# Patient Record
Sex: Female | Born: 1975 | Race: White | Hispanic: No | Marital: Married | Smoking: Never smoker
Health system: Southern US, Community
[De-identification: ages and names within clinical notes are randomized; demographics above are authoritative.]

---

## 2017-12-04 DIAGNOSIS — H524 Presbyopia: Secondary | ICD-10-CM | POA: Diagnosis not present

## 2019-05-09 ENCOUNTER — Other Ambulatory Visit: Payer: Self-pay | Admitting: *Deleted

## 2019-05-09 DIAGNOSIS — Z20822 Contact with and (suspected) exposure to covid-19: Secondary | ICD-10-CM

## 2019-05-10 LAB — NOVEL CORONAVIRUS, NAA: SARS-CoV-2, NAA: NOT DETECTED

## 2019-05-29 DIAGNOSIS — Z0000 Encounter for general adult medical examination without abnormal findings: Secondary | ICD-10-CM | POA: Diagnosis not present

## 2019-05-29 DIAGNOSIS — Z8249 Family history of ischemic heart disease and other diseases of the circulatory system: Secondary | ICD-10-CM | POA: Diagnosis not present

## 2019-06-27 DIAGNOSIS — Z01419 Encounter for gynecological examination (general) (routine) without abnormal findings: Secondary | ICD-10-CM | POA: Diagnosis not present

## 2019-07-14 ENCOUNTER — Other Ambulatory Visit: Payer: Self-pay

## 2019-07-14 DIAGNOSIS — Z20822 Contact with and (suspected) exposure to covid-19: Secondary | ICD-10-CM

## 2019-07-15 LAB — NOVEL CORONAVIRUS, NAA: SARS-CoV-2, NAA: NOT DETECTED

## 2019-08-08 ENCOUNTER — Other Ambulatory Visit: Payer: Self-pay

## 2019-08-08 DIAGNOSIS — Z20822 Contact with and (suspected) exposure to covid-19: Secondary | ICD-10-CM

## 2019-08-10 LAB — NOVEL CORONAVIRUS, NAA: SARS-CoV-2, NAA: NOT DETECTED

## 2019-08-12 ENCOUNTER — Other Ambulatory Visit: Payer: Self-pay

## 2019-11-10 ENCOUNTER — Ambulatory Visit: Payer: BC Managed Care – PPO | Attending: Internal Medicine

## 2019-11-10 DIAGNOSIS — Z20822 Contact with and (suspected) exposure to covid-19: Secondary | ICD-10-CM | POA: Insufficient documentation

## 2019-11-11 LAB — SARS-COV-2, NAA 2 DAY TAT

## 2019-11-11 LAB — NOVEL CORONAVIRUS, NAA: SARS-CoV-2, NAA: NOT DETECTED

## 2020-01-15 DIAGNOSIS — H5213 Myopia, bilateral: Secondary | ICD-10-CM | POA: Diagnosis not present

## 2020-06-15 DIAGNOSIS — Z0000 Encounter for general adult medical examination without abnormal findings: Secondary | ICD-10-CM | POA: Diagnosis not present

## 2020-06-15 DIAGNOSIS — Z23 Encounter for immunization: Secondary | ICD-10-CM | POA: Diagnosis not present

## 2020-06-15 DIAGNOSIS — Z13220 Encounter for screening for lipoid disorders: Secondary | ICD-10-CM | POA: Diagnosis not present

## 2020-06-16 ENCOUNTER — Other Ambulatory Visit: Payer: Self-pay | Admitting: Internal Medicine

## 2020-06-16 DIAGNOSIS — Z1231 Encounter for screening mammogram for malignant neoplasm of breast: Secondary | ICD-10-CM

## 2020-07-29 ENCOUNTER — Other Ambulatory Visit: Payer: Self-pay

## 2020-07-29 ENCOUNTER — Ambulatory Visit
Admission: RE | Admit: 2020-07-29 | Discharge: 2020-07-29 | Disposition: A | Discharge status: Home / Self Care | Payer: BC Managed Care – PPO | Source: Ambulatory Visit | Attending: Internal Medicine | Admitting: Internal Medicine

## 2020-07-29 DIAGNOSIS — Z1231 Encounter for screening mammogram for malignant neoplasm of breast: Secondary | ICD-10-CM

## 2021-09-16 IMAGING — MG DIGITAL SCREENING BILAT W/ TOMO W/ CAD
8 series · 9 of 24 positions shown · non-contrast
Comparison: None.

CLINICAL DATA: Screening.

EXAM:
DIGITAL SCREENING BILATERAL MAMMOGRAM WITH TOMO AND CAD

[L MLO synth-2D]
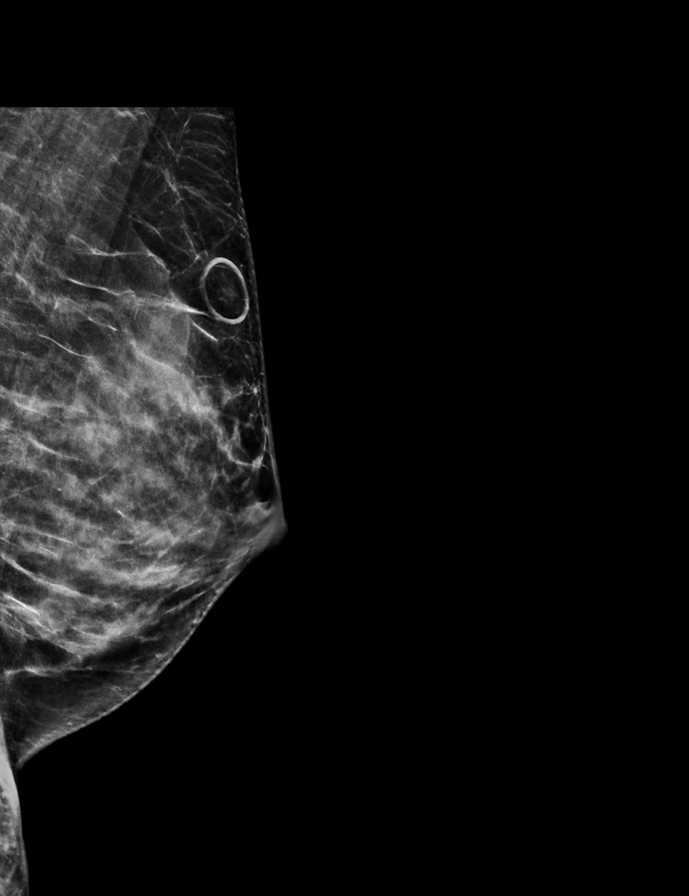

[L CC synth-2D]
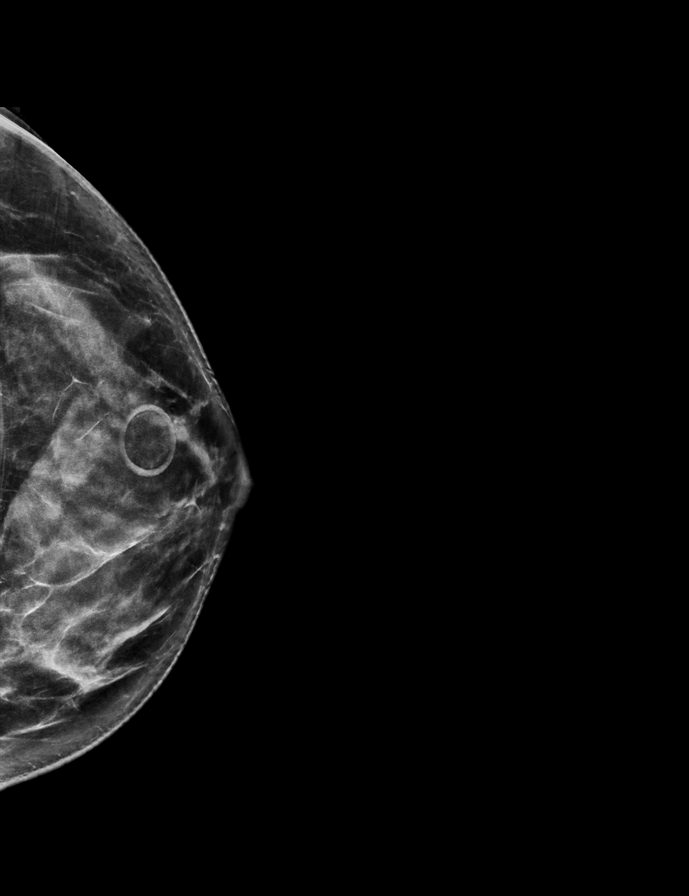

[R CC synth-2D]
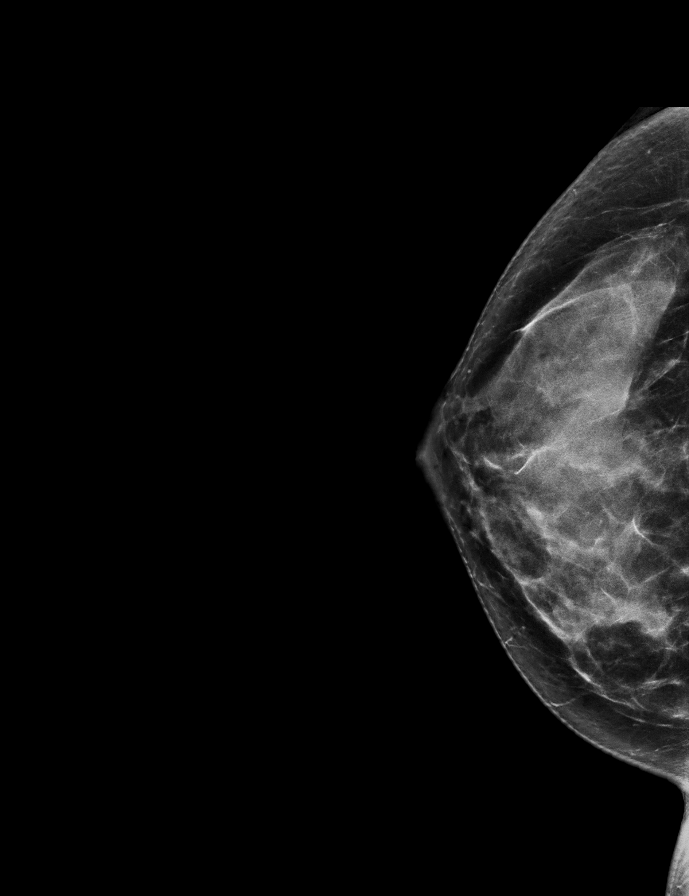

[R MLO synth-2D]
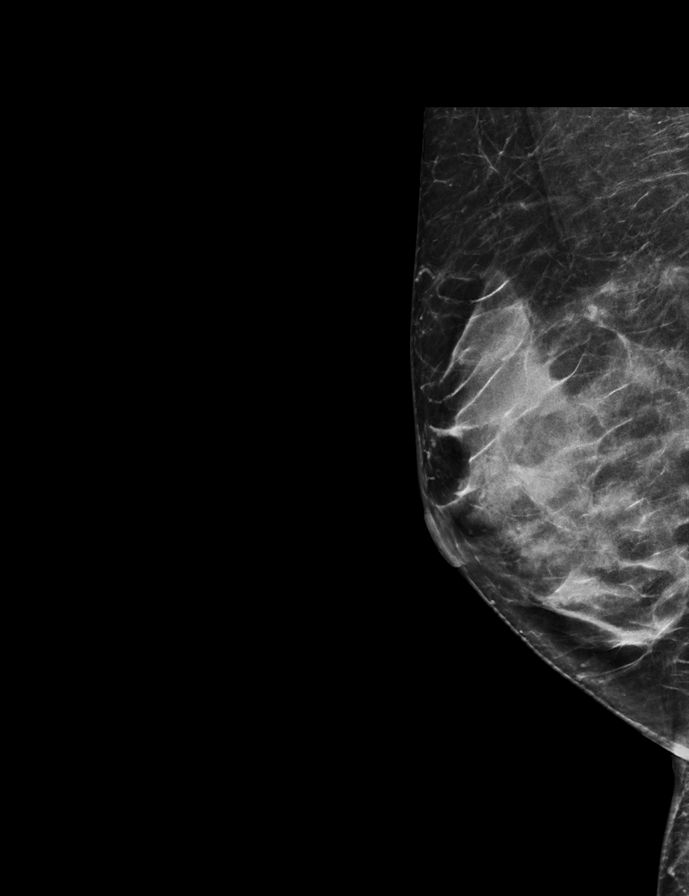

[L CC tomo · 2 of 67 frames shown]
[frame 22/67]
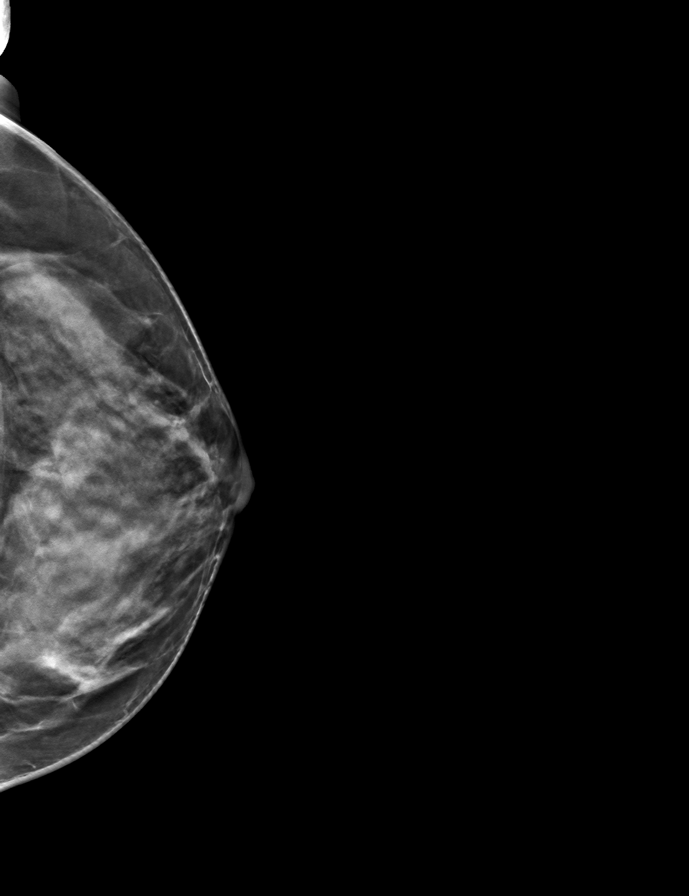
[frame 34/67]
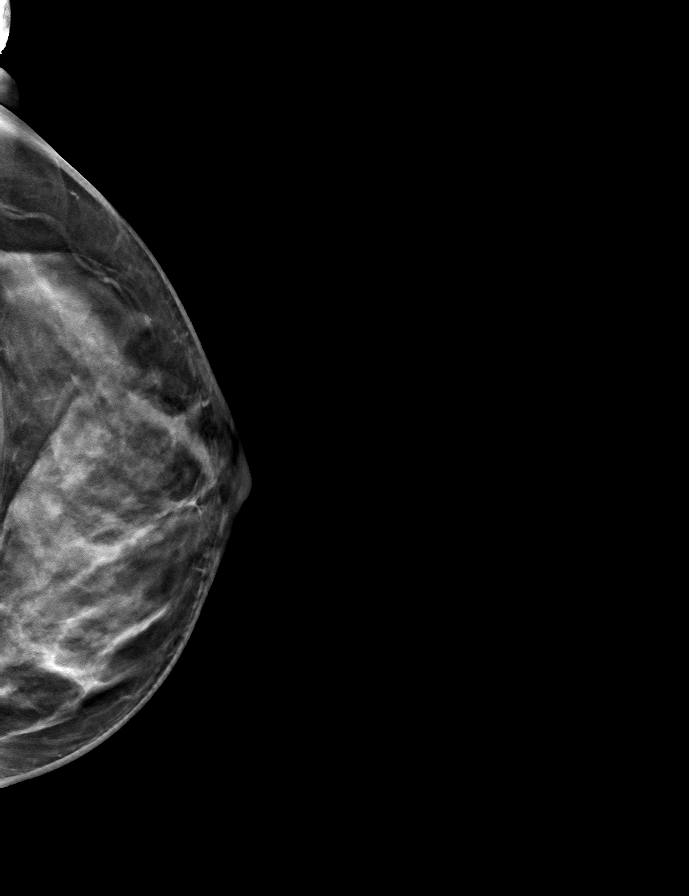

[L MLO tomo · tomo slice 31/60.0]
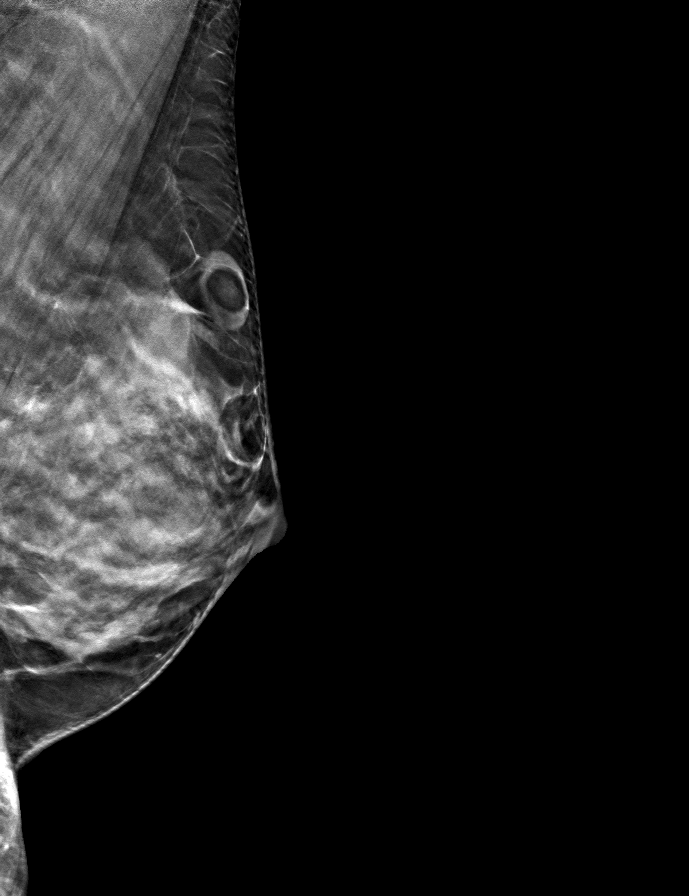

[R CC tomo · tomo slice 33/64.0]
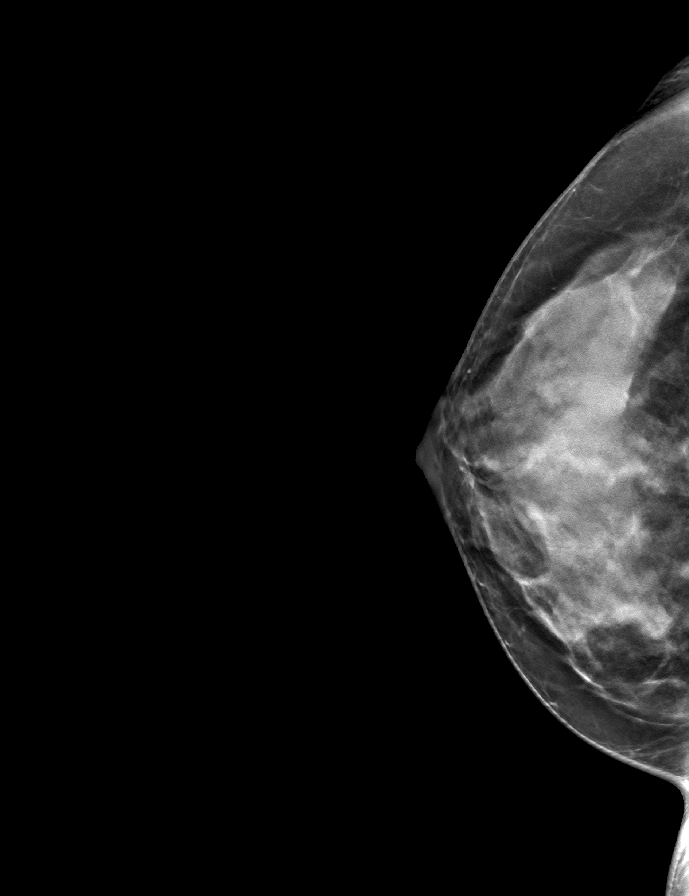

[R MLO tomo · tomo slice 31/61.0]
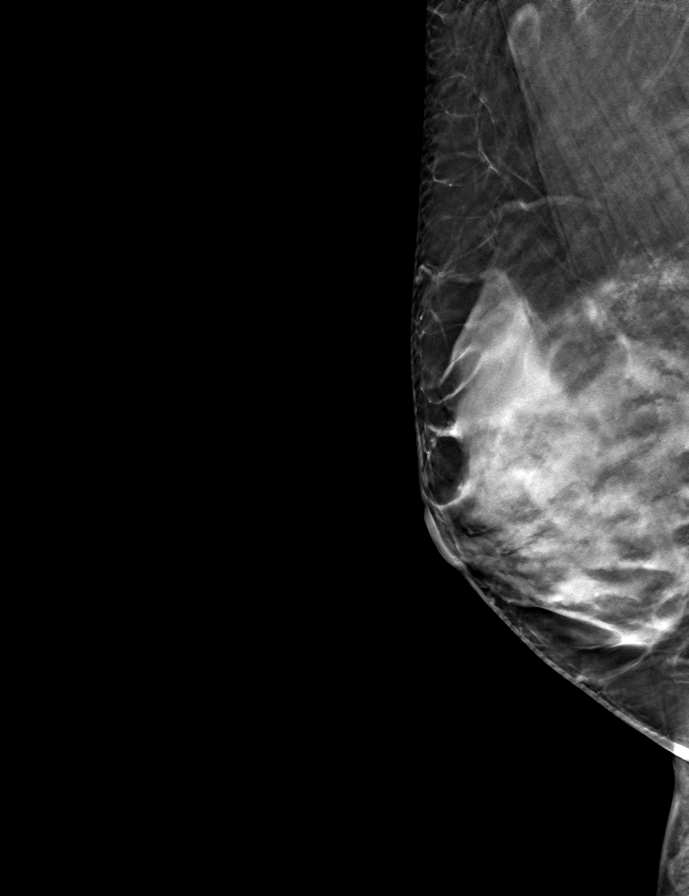

[9 of 24 positions shown; findings below may reference images not displayed]

ACR Breast Density Category c: The breast tissue is heterogeneously
dense, which may obscure small masses
FINDINGS: There are no findings suspicious for malignancy. Images were
processed with CAD.
IMPRESSION: No mammographic evidence of malignancy. A result letter of this
screening mammogram will be mailed directly to the patient.

RECOMMENDATION:
Screening mammogram in one year. (Code:EM-2-IHY)

BI-RADS CATEGORY  1: Negative.

## 2022-01-09 ENCOUNTER — Other Ambulatory Visit: Payer: Self-pay | Admitting: Internal Medicine

## 2022-01-09 DIAGNOSIS — Z1231 Encounter for screening mammogram for malignant neoplasm of breast: Secondary | ICD-10-CM

## 2022-02-13 ENCOUNTER — Ambulatory Visit
Admission: RE | Admit: 2022-02-13 | Discharge: 2022-02-13 | Disposition: A | Discharge status: Home / Self Care | Payer: 59 | Source: Ambulatory Visit | Attending: Internal Medicine | Admitting: Internal Medicine

## 2022-02-13 DIAGNOSIS — Z1231 Encounter for screening mammogram for malignant neoplasm of breast: Secondary | ICD-10-CM

## 2022-12-26 ENCOUNTER — Ambulatory Visit (INDEPENDENT_AMBULATORY_CARE_PROVIDER_SITE_OTHER): Payer: 59

## 2022-12-26 ENCOUNTER — Encounter: Payer: Self-pay | Admitting: Podiatry

## 2022-12-26 ENCOUNTER — Ambulatory Visit (INDEPENDENT_AMBULATORY_CARE_PROVIDER_SITE_OTHER): Payer: 59 | Admitting: Podiatry

## 2022-12-26 DIAGNOSIS — M2012 Hallux valgus (acquired), left foot: Secondary | ICD-10-CM | POA: Diagnosis not present

## 2022-12-26 DIAGNOSIS — M2011 Hallux valgus (acquired), right foot: Secondary | ICD-10-CM | POA: Diagnosis not present

## 2022-12-26 DIAGNOSIS — M2010 Hallux valgus (acquired), unspecified foot: Secondary | ICD-10-CM

## 2022-12-26 DIAGNOSIS — M722 Plantar fascial fibromatosis: Secondary | ICD-10-CM

## 2022-12-27 NOTE — Progress Notes (Signed)
  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 female presents with the above complaint.   ROS: Denies fever chills nausea vomit muscle aches pains calf pain back pain chest pain shortness of breath.  Conservative therapies such as anti-inflammatories shoe gear changes and sugar modifications have failed to alleviate patient's symptomatology.  No past medical history on file. No past surgical history on file.  Current Outpatient Medications:    [employer] GENTLE LAXATIVE 5 MG EC tablet, See admin instructions., Disp: , Rfl:    escitalopram (LEXAPRO) 5 MG tablet, 1 tablet Orally Once a day for 90 days, Disp: , Rfl:   No Known Allergies Review of Systems Objective:  There were no vitals filed for this visit.  General: Well developed, nourished, in no acute distress, alert and oriented x3   Dermatological: Skin is warm, dry and supple bilateral. Nails x 10 are well maintained; remaining integument appears unremarkable at this time. There are no open sores, no preulcerative lesions, no rash or signs of infection present.  Vascular: Dorsalis Pedis artery and Posterior Tibial artery pedal pulses are 2/4 bilateral with immedate capillary fill time. Pedal hair growth present. No varicosities and no lower extremity edema present bilateral.   Neruologic: Grossly intact via light touch bilateral. Vibratory intact via tuning fork bilateral. Protective threshold with Semmes Wienstein monofilament intact to all pedal sites bilateral. Patellar and Achilles deep tendon reflexes 2+ bilateral. No  Babinski or clonus noted bilateral.   Musculoskeletal: No gross boney pedal deformities bilateral. No pain, crepitus, or limitation noted with foot and ankle range of motion bilateral. Muscular strength 5/5 in all groups tested bilateral.  Hallux abductovalgus deformity of the left foot is noted.  She has a tenderness on the medial condyle with palpation and on end range of motion of the joint.  She also has pain on palpation MucoClear tubercle of the right heel.  No pain to medial lateral compression of the calcaneus.    Gait: Unassisted, Nonantalgic.    Radiographs:  Radiographs taken today demonstrate osseously mature individual right foot demonstrates retention of a 6 2 K wire first metatarsal.  The soft tissue increase in density plantar fascial cannula insertion site of the right heel.  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  Assessment & Plan:   Assessment: Hallux abductovalgus deformity of the left foot plan fasciitis right foot.  Plan: Discussed etiology pathology conservative surgical therapies at this point she would like to consider postponing surgery to the left foot until the fall.     Marygrace Sandoval T. Wright, [place]

## 2023-04-02 ENCOUNTER — Other Ambulatory Visit: Payer: Self-pay | Admitting: Internal Medicine

## 2023-04-02 DIAGNOSIS — Z1231 Encounter for screening mammogram for malignant neoplasm of breast: Secondary | ICD-10-CM

## 2023-04-16 ENCOUNTER — Ambulatory Visit: Admission: RE | Admit: 2023-04-16 | Payer: 59 | Source: Ambulatory Visit

## 2023-04-16 DIAGNOSIS — Z1231 Encounter for screening mammogram for malignant neoplasm of breast: Secondary | ICD-10-CM

## 2023-05-22 ENCOUNTER — Encounter: Payer: Self-pay | Admitting: Podiatry

## 2023-05-22 ENCOUNTER — Ambulatory Visit (INDEPENDENT_AMBULATORY_CARE_PROVIDER_SITE_OTHER): Payer: 59 | Admitting: Podiatry

## 2023-05-22 DIAGNOSIS — M2012 Hallux valgus (acquired), left foot: Secondary | ICD-10-CM | POA: Diagnosis not present

## 2023-05-22 NOTE — Progress Notes (Signed)
She presents today discuss the ankle bunion deformity.  She states that she had a right Winfex many years ago and become more painful with shoe gear.  She is try different shoe gear.  She is also tried anti-inflammatories physical therapy padding and inserts.  She denies fever chills nausea vomiting muscle aches pains calf pain back pain chest pain shortness of breath.  States that in general she is in good health.  No known drug allergies  Objective: Vital signs are stable alert oriented x 3.  Pulses are palpable.  Muscle strength is normal +5/5 dorsiflexors plantar flexors inverters everters.  She does have some minimal crepitation on range of motion.  She does have a good full range of motion however.  Hallux abductovalgus deformity is semiflexible appears to be tracking but not yet tract found she has painful medial eminence of the head of the first metatarsal with palpation of the medial dorsal cutaneous nerve.  Radiographs previously evaluated demonstrate an increase in the first intermetatarsal angle greater than normal value of the increased PASA and a hallux abductus angle greater than normal value.  Does not appear to be any dorsal spurring or any fractures.  Assessment: Hallux abductovalgus deformity bilateral right 1 have been corrected left 1 is painful and needs correction.  Plan: Discussed etiology pathology conservative versus surgical therapies at this point consented her today for an [facility] bunion repair with screw fixation.  We did discuss the pros and cons of this surgery as well as the possible side effects associated with it.  Possible complications which may include but are not limited to postop pain bleeding swell infection recurrence need for further surgery overcorrection under correction loss of digit loss limb loss of life.  We did discuss the necessity for sciatic block cam boot and for her to stay off of this for least the first 2 weeks 45 minutes/h.  She understands this  and does need to agree to follow-up with her in December for the surgery.  Signed all 3 pages a consent form today and was taken to scheduling.

## 2023-07-05 ENCOUNTER — Telehealth: Payer: Self-pay | Admitting: Podiatry

## 2023-07-05 NOTE — Telephone Encounter (Signed)
DOS-07/27/2023  [place] BUNIONECTOMY [id number]  [employer] EFFECTIVE DATE-08/21/2022  DEDUCTIBLE-$4500.00 WITH REMAINING $3417.33 OOP-$6500.00 WITH REMAINING $5409.50 COINSURANCE- 30%   PER THE [employer] PORTAL, PRIOR AUTH HAS BEEN APPROVED FOR CPT CODE 75643, GOOD FROM 07/27/2023 - 10/25/2023.  AUTH REFERENCE #: [id number]

## 2023-07-25 ENCOUNTER — Other Ambulatory Visit: Payer: Self-pay | Admitting: Podiatry

## 2023-07-25 MED ORDER — CEPHALEXIN 500 MG PO CAPS: 500.0000 mg | ORAL_CAPSULE | Freq: Three times a day (TID) | ORAL | 0 refills | Status: DC

## 2023-07-25 MED ORDER — OXYCODONE-ACETAMINOPHEN 10-325 MG PO TABS: 1.0000 | ORAL_TABLET | Freq: Three times a day (TID) | ORAL | 0 refills | Status: AC | PRN

## 2023-07-25 MED ORDER — ONDANSETRON HCL 4 MG PO TABS: 4.0000 mg | ORAL_TABLET | Freq: Three times a day (TID) | ORAL | 0 refills | Status: AC | PRN

## 2023-07-27 DIAGNOSIS — M2012 Hallux valgus (acquired), left foot: Secondary | ICD-10-CM | POA: Diagnosis not present

## 2023-07-28 ENCOUNTER — Encounter: Payer: Self-pay | Admitting: Podiatry

## 2023-07-28 MED ORDER — IBUPROFEN 800 MG PO TABS: 800.0000 mg | ORAL_TABLET | Freq: Four times a day (QID) | ORAL | 1 refills | Status: AC | PRN

## 2023-07-31 ENCOUNTER — Encounter: Payer: Self-pay | Admitting: Podiatry

## 2023-07-31 ENCOUNTER — Ambulatory Visit (INDEPENDENT_AMBULATORY_CARE_PROVIDER_SITE_OTHER): Payer: 59

## 2023-07-31 ENCOUNTER — Ambulatory Visit (INDEPENDENT_AMBULATORY_CARE_PROVIDER_SITE_OTHER): Payer: 59 | Admitting: Podiatry

## 2023-07-31 DIAGNOSIS — M2012 Hallux valgus (acquired), left foot: Secondary | ICD-10-CM

## 2023-07-31 DIAGNOSIS — Z9889 Other specified postprocedural states: Secondary | ICD-10-CM

## 2023-07-31 NOTE — Progress Notes (Signed)
She presents today for her first postop visit date of surgery was 07/27/2023.  She had a left foot [place] bunion repair with screw fixation.  States this really been painful left first few days but has slowly started to subside now she is taking 800 mg of ibuprofen and no longer taking the narcotic.  Denies fever chills nausea vomiting muscle aches pains calf pain back pain chest pain shortness of breath.  She presents today ambulating with her cam boot dry sterile dressing intact once removed demonstrates mild edema no erythema cellulitis drainage or odor incision site is gone on to heal uneventfully.  Radiographically does demonstrate correction with the capital osteotomy to the first metatarsal and single screw fixation.  Assessment: Well-healing surgical foot.  Plan: Redressed today dressed a compressive dressing encouraged her to continue to keep the foot elevated and to move the toes regularly so that they do not get stiff.  She understands this is amenable to it we will follow-up with me in a week or 2 at which time will try to remove the suture ends.

## 2023-08-02 ENCOUNTER — Encounter: Payer: 59 | Admitting: Podiatry

## 2023-08-06 ENCOUNTER — Telehealth: Payer: Self-pay

## 2023-08-06 NOTE — Telephone Encounter (Signed)
Patient called - she is really doing great - wants to know when she can get her foot wet/Shower ? Please advise -thanks

## 2023-08-21 ENCOUNTER — Ambulatory Visit (INDEPENDENT_AMBULATORY_CARE_PROVIDER_SITE_OTHER): Payer: 59 | Admitting: Podiatry

## 2023-08-21 DIAGNOSIS — M2012 Hallux valgus (acquired), left foot: Secondary | ICD-10-CM

## 2023-08-21 NOTE — Progress Notes (Signed)
 She presents today for a postop visit date of surgery was 07/27/2023.  Left foot [place] bunionectomy with screw fixation states that the pain is mostly gone she is able to move it better.  States that the swelling is gone down and she is feeling much better.  Objective: Vital signs are stable alert oriented times 3 suture ends were intact removed today no dehiscence she has great range of motion passively and actively.  Assessment: Well-healing surgical foot.  Plan: Discharge in a.m. Darco shoe today she will wear this the majority of the time and I will follow-up with her on in 3 weeks for set of x-rays.

## 2023-09-11 ENCOUNTER — Encounter: Payer: 59 | Admitting: Podiatry

## 2023-09-13 ENCOUNTER — Encounter: Payer: 59 | Admitting: Podiatry

## 2023-09-27 ENCOUNTER — Encounter: Payer: Self-pay | Admitting: Podiatry

## 2023-09-27 ENCOUNTER — Ambulatory Visit (INDEPENDENT_AMBULATORY_CARE_PROVIDER_SITE_OTHER): Payer: 59

## 2023-09-27 ENCOUNTER — Ambulatory Visit: Payer: 59 | Admitting: Podiatry

## 2023-09-27 DIAGNOSIS — M2012 Hallux valgus (acquired), left foot: Secondary | ICD-10-CM

## 2023-09-27 DIAGNOSIS — Z9889 Other specified postprocedural states: Secondary | ICD-10-CM

## 2023-09-27 NOTE — Progress Notes (Signed)
 She presents today for her third postop visit date of surgery 07/27/2023.  She has a left [employer] with screw fixation.  She states that is doing so much better she is very happy with the outcome.  Objective: Vital signs are stable alert oriented x 3 which decrease in edema much decrease in tenderness she is got a great range of motion of the first metatarsal phalangeal joint.  Radiographs taken today demonstrate well-healing surgical foot internal fixation is in good position bone appears to be healing that has not healed completely as of yet at the osteotomy site.  Assessment: Well-healing surgical foot x 2 weeks.  Plan: Follow-up with me in 1 month.  I will allow her to get back to her riding her horse.

## 2023-10-25 ENCOUNTER — Encounter: Payer: 59 | Admitting: Podiatry

## 2023-10-30 ENCOUNTER — Ambulatory Visit (INDEPENDENT_AMBULATORY_CARE_PROVIDER_SITE_OTHER): Payer: 59 | Admitting: Podiatry

## 2023-10-30 ENCOUNTER — Encounter: Payer: Self-pay | Admitting: Podiatry

## 2023-10-30 ENCOUNTER — Ambulatory Visit (INDEPENDENT_AMBULATORY_CARE_PROVIDER_SITE_OTHER)

## 2023-10-30 DIAGNOSIS — M2012 Hallux valgus (acquired), left foot: Secondary | ICD-10-CM

## 2023-10-30 DIAGNOSIS — Z9889 Other specified postprocedural states: Secondary | ICD-10-CM

## 2023-10-31 NOTE — Progress Notes (Signed)
 She presents today date of surgery 07/27/2023.  She had a left [place] bunionectomy with screw fixation.  States that she just returned from [place] where she did a ton of walking and had no problems whatsoever.  Objective: Vital signs are stable she is alert oriented x 3.  Pulses are palpable.  Mild edema about the distal aspect of the foot.  She has good range of motion of the first metatarsophalangeal joint without crepitation.  The foot is sitting rectus  Radiographs taken today demonstrate osseously mature individual good bone mineralization healing first metatarsal capital osteotomy with screw fixation which appears to be in good position and intact.  Assessment: Well-healing surgical foot.  Plan: Follow-up with me on an as-needed basis.

## 2024-07-22 ENCOUNTER — Other Ambulatory Visit: Payer: Self-pay | Admitting: Internal Medicine

## 2024-07-22 DIAGNOSIS — Z1231 Encounter for screening mammogram for malignant neoplasm of breast: Secondary | ICD-10-CM

## 2024-08-26 ENCOUNTER — Ambulatory Visit
Admission: RE | Admit: 2024-08-26 | Discharge: 2024-08-26 | Disposition: A | Source: Ambulatory Visit | Attending: Internal Medicine | Admitting: Internal Medicine

## 2024-08-26 DIAGNOSIS — Z1231 Encounter for screening mammogram for malignant neoplasm of breast: Secondary | ICD-10-CM
